# Patient Record
Sex: Male | Born: 1980 | Race: Black or African American | Hispanic: No | Marital: Single | Smoking: Current every day smoker
Health system: Southern US, Community
[De-identification: ages and names within clinical notes are randomized; demographics above are authoritative.]

## PROBLEM LIST (undated history)

## (undated) DIAGNOSIS — K5792 Diverticulitis of intestine, part unspecified, without perforation or abscess without bleeding: Secondary | ICD-10-CM

---

## 2006-01-31 ENCOUNTER — Emergency Department: Payer: Self-pay | Admitting: Emergency Medicine

## 2006-04-24 ENCOUNTER — Emergency Department: Payer: Self-pay | Admitting: Emergency Medicine

## 2006-10-14 ENCOUNTER — Emergency Department: Payer: Self-pay | Admitting: Emergency Medicine

## 2014-01-03 ENCOUNTER — Emergency Department: Payer: Self-pay | Admitting: Emergency Medicine

## 2015-11-22 ENCOUNTER — Emergency Department
Admission: EM | Admit: 2015-11-22 | Discharge: 2015-11-22 | Discharge status: Against Medical Advice | Payer: 59 | Attending: Emergency Medicine | Admitting: Emergency Medicine

## 2015-11-22 ENCOUNTER — Encounter: Payer: Self-pay | Admitting: Emergency Medicine

## 2015-11-22 DIAGNOSIS — F17200 Nicotine dependence, unspecified, uncomplicated: Secondary | ICD-10-CM | POA: Insufficient documentation

## 2015-11-22 DIAGNOSIS — R197 Diarrhea, unspecified: Secondary | ICD-10-CM | POA: Diagnosis not present

## 2015-11-22 DIAGNOSIS — R51 Headache: Secondary | ICD-10-CM | POA: Insufficient documentation

## 2015-11-22 DIAGNOSIS — R1110 Vomiting, unspecified: Secondary | ICD-10-CM | POA: Insufficient documentation

## 2015-11-22 NOTE — ED Notes (Signed)
Reports n/v/d x 2 days, face pain, bodyaches.  Skin w/d.

## 2015-11-23 ENCOUNTER — Telehealth: Payer: Self-pay | Admitting: Emergency Medicine

## 2015-11-23 NOTE — ED Notes (Signed)
Called patient due to lwot to inquire about condition and follow up plans. Left message with person who answered to have him call me for any questions.

## 2016-06-02 ENCOUNTER — Emergency Department
Admission: EM | Admit: 2016-06-02 | Discharge: 2016-06-02 | Disposition: A | Discharge status: Home / Self Care | Payer: 59 | Attending: Emergency Medicine | Admitting: Emergency Medicine

## 2016-06-02 ENCOUNTER — Encounter: Payer: Self-pay | Admitting: Emergency Medicine

## 2016-06-02 ENCOUNTER — Emergency Department: Payer: 59

## 2016-06-02 DIAGNOSIS — R1032 Left lower quadrant pain: Secondary | ICD-10-CM | POA: Diagnosis present

## 2016-06-02 DIAGNOSIS — K5731 Diverticulosis of large intestine without perforation or abscess with bleeding: Secondary | ICD-10-CM | POA: Diagnosis not present

## 2016-06-02 DIAGNOSIS — K5733 Diverticulitis of large intestine without perforation or abscess with bleeding: Secondary | ICD-10-CM

## 2016-06-02 DIAGNOSIS — F17210 Nicotine dependence, cigarettes, uncomplicated: Secondary | ICD-10-CM | POA: Insufficient documentation

## 2016-06-02 LAB — COMPREHENSIVE METABOLIC PANEL
ALT: 18 U/L (ref 17–63)
AST: 16 U/L (ref 15–41)
Albumin: 4.5 g/dL (ref 3.5–5.0)
Alkaline Phosphatase: 54 U/L (ref 38–126)
Anion gap: 7 (ref 5–15)
BILIRUBIN TOTAL: 0.4 mg/dL (ref 0.3–1.2)
BUN: 12 mg/dL (ref 6–20)
CALCIUM: 9.5 mg/dL (ref 8.9–10.3)
CO2: 25 mmol/L (ref 22–32)
CREATININE: 1.05 mg/dL (ref 0.61–1.24)
Chloride: 106 mmol/L (ref 101–111)
GFR calc non Af Amer: >60 mL/min (ref >60)
GLUCOSE: 86 mg/dL (ref 65–99)
Potassium: 4.3 mmol/L (ref 3.5–5.1)
Sodium: 138 mmol/L (ref 135–145)
Total Protein: 7.6 g/dL (ref 6.5–8.1)

## 2016-06-02 LAB — CBC
HEMATOCRIT: 41.8 % (ref 40.0–52.0)
Hemoglobin: 14 g/dL (ref 13.0–18.0)
MCH: 26.8 pg (ref 26.0–34.0)
MCHC: 33.4 g/dL (ref 32.0–36.0)
MCV: 80.4 fL (ref 80.0–100.0)
Platelets: 242 10*3/uL (ref 150–440)
RBC: 5.2 MIL/uL (ref 4.40–5.90)
RDW: 15.1 % — ABNORMAL HIGH (ref 11.5–14.5)
WBC: 10 10*3/uL (ref 3.8–10.6)

## 2016-06-02 LAB — LIPASE, BLOOD: Lipase: 79 U/L — ABNORMAL HIGH (ref 11–51)

## 2016-06-02 MED ORDER — SODIUM CHLORIDE 0.9 % IV BOLUS (SEPSIS)
1000.0000 mL | Freq: Once | INTRAVENOUS | Status: AC
  Administered 2016-06-02: 1000 mL via INTRAVENOUS

## 2016-06-02 MED ORDER — METRONIDAZOLE 500 MG PO TABS
500.0000 mg | ORAL_TABLET | Freq: Once | ORAL | Status: AC
  Administered 2016-06-02: 500 mg via ORAL
  Filled 2016-06-02: qty 1

## 2016-06-02 MED ORDER — CIPROFLOXACIN HCL 500 MG PO TABS
500.0000 mg | ORAL_TABLET | Freq: Once | ORAL | Status: AC
  Administered 2016-06-02: 500 mg via ORAL
  Filled 2016-06-02: qty 1

## 2016-06-02 MED ORDER — DIATRIZOATE MEGLUMINE & SODIUM 66-10 % PO SOLN
15.0000 mL | Freq: Once | ORAL | Status: AC
  Administered 2016-06-02: 15 mL via ORAL

## 2016-06-02 MED ORDER — CIPROFLOXACIN HCL 500 MG PO TABS: 500.0000 mg | ORAL_TABLET | Freq: Two times a day (BID) | ORAL | Status: AC

## 2016-06-02 MED ORDER — METRONIDAZOLE 500 MG PO TABS: 500.0000 mg | ORAL_TABLET | Freq: Three times a day (TID) | ORAL | Status: DC

## 2016-06-02 MED ORDER — OXYCODONE-ACETAMINOPHEN 5-325 MG PO TABS
2.0000 | ORAL_TABLET | Freq: Once | ORAL | Status: AC
  Administered 2016-06-02: 2 via ORAL
  Filled 2016-06-02: qty 2

## 2016-06-02 MED ORDER — MORPHINE SULFATE (PF) 4 MG/ML IV SOLN
4.0000 mg | Freq: Once | INTRAVENOUS | Status: AC
Start: 2016-06-02 — End: 2016-06-02
  Administered 2016-06-02: 4 mg via INTRAVENOUS
  Filled 2016-06-02: qty 1

## 2016-06-02 MED ORDER — ONDANSETRON HCL 4 MG/2ML IJ SOLN
4.0000 mg | Freq: Once | INTRAMUSCULAR | Status: AC
  Administered 2016-06-02: 4 mg via INTRAVENOUS
  Filled 2016-06-02: qty 2

## 2016-06-02 MED ORDER — OXYCODONE-ACETAMINOPHEN 5-325 MG PO TABS: 1.0000 | ORAL_TABLET | Freq: Four times a day (QID) | ORAL | Status: DC | PRN

## 2016-06-02 MED ORDER — IOPAMIDOL (ISOVUE-300) INJECTION 61%
100.0000 mL | Freq: Once | INTRAVENOUS | Status: AC | PRN
  Administered 2016-06-02: 100 mL via INTRAVENOUS

## 2016-06-02 NOTE — ED Provider Notes (Signed)
[facility] Emergency Department Provider Note  Time seen: 12:13 PM  I have reviewed the triage vital signs and the nursing notes.   HISTORY  Chief Complaint Abdominal Pain and Melena    HPI Edward Pennington is a 35 y.o. male with no past medical history presents the emergency department left lower quadrant pain. According to the patient approximately 2 months ago he developed sharp stabbing left lower quadrant pain which lasted approximately one day and then resolved. Patient states for the past 2 weeks he has been experiencing similar symptoms intermittently in the left lower quadrant. Worse pain today which she describes as severe stabbing mostly in the left lower quadrant. He is also noted somewhat dark stool over the past one week. Denies any nausea or vomiting. Denies dysuria. Denies any bloody stool. Patient denies use of Pepto-Bismol or iron supplements.     History reviewed. No pertinent past medical history.  There are no active problems to display for this patient.   History reviewed. No pertinent past surgical history.  No current outpatient prescriptions on file.  Allergies Review of patient's allergies indicates no known allergies.  History reviewed. No pertinent family history.  Social History Social History  Substance Use Topics  . Smoking status: Current Every Day Smoker -- 1.00 packs/day    Types: Cigarettes  . Smokeless tobacco: None  . Alcohol Use: No    Review of Systems Constitutional: Negative for fever. Cardiovascular: Negative for chest pain. Respiratory: Negative for shortness of breath. Gastrointestinal: Left lower quadrant abdominal pain. Dark stool. Genitourinary: Negative for dysuria. Neurological: Negative for headache 10-point ROS otherwise negative.  ____________________________________________   PHYSICAL EXAM:  VITAL SIGNS: ED Triage Vitals  Enc Vitals Group     BP 06/02/16 1134 144/85 mmHg     Pulse  Rate 06/02/16 1134 103     Resp 06/02/16 1134 20     Temp 06/02/16 1134 98.3 F (36.8 C)     Temp Source 06/02/16 1134 Oral     SpO2 06/02/16 1134 99 %     Weight 06/02/16 1134 250 lb (113.399 kg)     Height 06/02/16 1134 6' (1.829 m)     Head Cir --      Peak Flow --      Pain Score 06/02/16 1137 9     Pain Loc --      Pain Edu? --      Excl. in GC? --     Constitutional: Alert and oriented. Well appearing and in no distress. Eyes: Normal exam ENT   Head: Normocephalic and atraumatic.   Mouth/Throat: Mucous membranes are moist. Cardiovascular: Normal rate, regular rhythm. No murmur Respiratory: Normal respiratory effort without tachypnea nor retractions. Breath sounds are clear Gastrointestinal: Soft, moderate left lower quadrant tenderness palpation. Mild left upper quadrant tenderness palpation. No rebound or guarding. No distention. Musculoskeletal: Nontender with normal range of motion in all extremities. Neurologic:  Normal speech and language. No gross focal neurologic deficits  Skin:  Skin is warm, dry and intact.  Psychiatric: Mood and affect are normal.   ____________________________________________   RADIOLOGY  CT consistent with uncomplicated diverticulitis of the sigmoid colon.  ____________________________________________   INITIAL IMPRESSION / ASSESSMENT AND PLAN / ED COURSE  Pertinent labs & imaging results that were available during my care of the patient were reviewed by me and considered in my medical decision making (see chart for details).  The patient presents the emergency department left lower quadrant abdominal pain.  We will check labs, likely proceed with a CT of the abdomen/pelvis. Perform a rectal examination, and closely monitor in the emergency department.  CT consistent with uncomplicated diverticulitis of the sigmoid colon. We'll place on antibiotics and discharged home with pain medication. The patient is agreeable to plan.  Urinalysis is still pending, the urinalysis machine is down and they do not know when it will be running again. As the patient has no urinary complaints. Discharge the patient home and I will follow-up on the urinalysis once available.  ____________________________________________   FINAL CLINICAL IMPRESSION(S) / ED DIAGNOSES  Left lower quadrant abdominal pain Diverticulitis  Minna AntisKevin Garnetta Fedrick, MD 06/02/16 1334

## 2016-06-02 NOTE — Discharge Instructions (Signed)
Please take your antibiotics as prescribed. Pain medication as needed. Please follow-up with GI medicine by calling the number provided to arrange a follow up appointment. Return to the emergency department for any worsening abdominal pain, increased bloody stool, or fever.   Diverticulitis Diverticulitis is when small pockets that have formed in your colon (large intestine) become infected or swollen. HOME CARE  Follow your doctor's instructions.  Follow a special diet if told by your doctor.  When you feel better, your doctor may tell you to change your diet. You may be told to eat a lot of fiber. Fruits and vegetables are good sources of fiber. Fiber makes it easier to poop (have bowel movements).  Take supplements or probiotics as told by your doctor.  Only take medicines as told by your doctor.  Keep all follow-up visits with your doctor. GET HELP IF:  Your pain does not get better.  You have a hard time eating food.  You are not pooping like normal. GET HELP RIGHT AWAY IF:  Your pain gets worse.  Your problems do not get better.  Your problems suddenly get worse.  You have a fever.  You keep throwing up (vomiting).  You have bloody or black, tarry poop (stool). MAKE SURE YOU:   Understand these instructions.  Will watch your condition.  Will get help right away if you are not doing well or get worse.   This information is not intended to replace advice given to you by your health care provider. Make sure you discuss any questions you have with your health care provider.   Document Released: 05/15/2008 Document Revised: 12/02/2013 Document Reviewed: 10/22/2013 Elsevier Interactive Patient Education [employer]2016 [employer]vier Inc.

## 2016-06-02 NOTE — ED Notes (Signed)
Pt reports pain in bottom of stomach below navel x 2 weeks.  Pt states he has seen black stools for the past 4-5 days.  Similar episode happened 2 months ago and last a week. Pt describe pain as feeling like a knife stabbing him in the stomach. Denies vomiting.

## 2016-06-08 ENCOUNTER — Emergency Department
Admission: EM | Admit: 2016-06-08 | Discharge: 2016-06-08 | Disposition: A | Discharge status: Home / Self Care | Payer: 59 | Attending: Emergency Medicine | Admitting: Emergency Medicine

## 2016-06-08 ENCOUNTER — Encounter: Payer: Self-pay | Admitting: Emergency Medicine

## 2016-06-08 DIAGNOSIS — F17210 Nicotine dependence, cigarettes, uncomplicated: Secondary | ICD-10-CM | POA: Diagnosis not present

## 2016-06-08 DIAGNOSIS — R1032 Left lower quadrant pain: Secondary | ICD-10-CM | POA: Diagnosis present

## 2016-06-08 DIAGNOSIS — Z792 Long term (current) use of antibiotics: Secondary | ICD-10-CM | POA: Diagnosis not present

## 2016-06-08 HISTORY — DX: Diverticulitis of intestine, part unspecified, without perforation or abscess without bleeding: K57.92

## 2016-06-08 LAB — CBC WITH DIFFERENTIAL/PLATELET
BASOS ABS: 0 10*3/uL (ref 0–0.1)
BASOS PCT: 1 %
Eosinophils Absolute: 0.9 10*3/uL — ABNORMAL HIGH (ref 0–0.7)
Eosinophils Relative: 21 %
HEMATOCRIT: 39.1 % — AB (ref 40.0–52.0)
HEMOGLOBIN: 13.2 g/dL (ref 13.0–18.0)
LYMPHS PCT: 37 %
Lymphs Abs: 1.5 10*3/uL (ref 1.0–3.6)
MCH: 27.1 pg (ref 26.0–34.0)
MCHC: 33.8 g/dL (ref 32.0–36.0)
MCV: 80.1 fL (ref 80.0–100.0)
Monocytes Absolute: 0.5 10*3/uL (ref 0.2–1.0)
Monocytes Relative: 13 %
NEUTROS ABS: 1.1 10*3/uL — AB (ref 1.4–6.5)
NEUTROS PCT: 28 %
Platelets: 267 10*3/uL (ref 150–440)
RBC: 4.88 MIL/uL (ref 4.40–5.90)
RDW: 14.8 % — AB (ref 11.5–14.5)
WBC: 4 10*3/uL (ref 3.8–10.6)

## 2016-06-08 LAB — COMPREHENSIVE METABOLIC PANEL
ALT: 21 U/L (ref 17–63)
ANION GAP: 8 (ref 5–15)
AST: 19 U/L (ref 15–41)
Albumin: 3.8 g/dL (ref 3.5–5.0)
Alkaline Phosphatase: 42 U/L (ref 38–126)
BILIRUBIN TOTAL: 0.2 mg/dL — AB (ref 0.3–1.2)
BUN: 10 mg/dL (ref 6–20)
CHLORIDE: 109 mmol/L (ref 101–111)
CO2: 24 mmol/L (ref 22–32)
Calcium: 9 mg/dL (ref 8.9–10.3)
Creatinine, Ser: 1.06 mg/dL (ref 0.61–1.24)
GFR calc Af Amer: >60 mL/min (ref >60)
Glucose, Bld: 91 mg/dL (ref 65–99)
POTASSIUM: 3.8 mmol/L (ref 3.5–5.1)
Sodium: 141 mmol/L (ref 135–145)
TOTAL PROTEIN: 6.9 g/dL (ref 6.5–8.1)

## 2016-06-08 LAB — LIPASE, BLOOD: LIPASE: 25 U/L (ref 11–51)

## 2016-06-08 MED ORDER — SODIUM CHLORIDE 0.9 % IV BOLUS (SEPSIS)
1000.0000 mL | Freq: Once | INTRAVENOUS | Status: AC
  Administered 2016-06-08: 1000 mL via INTRAVENOUS

## 2016-06-08 MED ORDER — ONDANSETRON HCL 4 MG/2ML IJ SOLN
4.0000 mg | Freq: Once | INTRAMUSCULAR | Status: AC
  Administered 2016-06-08: 4 mg via INTRAVENOUS
  Filled 2016-06-08: qty 2

## 2016-06-08 MED ORDER — KETOROLAC TROMETHAMINE 60 MG/2ML IM SOLN
INTRAMUSCULAR | Status: AC
  Filled 2016-06-08: qty 2

## 2016-06-08 MED ORDER — NAPROXEN 500 MG PO TABS: 500.0000 mg | ORAL_TABLET | Freq: Two times a day (BID) | ORAL | Status: AC

## 2016-06-08 MED ORDER — ONDANSETRON 4 MG PO TBDP: 4.0000 mg | ORAL_TABLET | Freq: Three times a day (TID) | ORAL | Status: AC | PRN

## 2016-06-08 MED ORDER — KETOROLAC TROMETHAMINE 30 MG/ML IJ SOLN
15.0000 mg | Freq: Once | INTRAMUSCULAR | Status: AC
  Administered 2016-06-08: 15 mg via INTRAVENOUS

## 2016-06-08 NOTE — ED Provider Notes (Signed)
[facility] Emergency Department Provider Note  ____________________________________________  Time seen: 10:15 AM  I have reviewed the triage vital signs and the nursing notes.   HISTORY  Chief Complaint Abdominal Pain    HPI Edward Pennington is a 35 y.o. male who complains of left-sided abdominal pain for the past week. He reports that he was seen in the emergency room about a week ago, had a CT scan and labs, diagnosed with diverticulitis and started on antibiotics. He reports however though with that he's been having increased nausea which is causing frequent vomiting. He is taking the antibiotics but also feels like he vomited so much is not sure if he is actually absorbing it. Having trouble eating and drinking as well. No chest pain shortness of breath fever chills or sweats. No dizziness or syncope.  Pain is in left lower quadrant, nonradiating. Moderately severe, aching. No aggravating or alleviating factors.     Past Medical History  Diagnosis Date  . Diverticulitis      There are no active problems to display for this patient.    History reviewed. No pertinent past surgical history.   Current Outpatient Rx  Name  Route  Sig  Dispense  Refill  . ciprofloxacin (CIPRO) 500 MG tablet   Oral   Take 1 tablet (500 mg total) by mouth 2 (two) times daily.   28 tablet   0   . metroNIDAZOLE (FLAGYL) 500 MG tablet   Oral   Take 1 tablet (500 mg total) by mouth 3 (three) times daily.   42 tablet   0   . naproxen (NAPROSYN) 500 MG tablet   Oral   Take 1 tablet (500 mg total) by mouth 2 (two) times daily with a meal.   20 tablet   0   . ondansetron (ZOFRAN ODT) 4 MG disintegrating tablet   Oral   Take 1 tablet (4 mg total) by mouth every 8 (eight) hours as needed for nausea or vomiting.   20 tablet   0   . oxyCODONE-acetaminophen (ROXICET) 5-325 MG tablet   Oral   Take 1 tablet by mouth every 6 (six) hours as needed.   20 tablet    0      Allergies Review of patient's allergies indicates no known allergies.   History reviewed. No pertinent family history.  Social History Social History  Substance Use Topics  . Smoking status: Current Every Day Smoker -- 1.00 packs/day    Types: Cigarettes  . Smokeless tobacco: None  . Alcohol Use: No    Review of Systems  Constitutional:   No fever or chills.  ENT:   No sore throat. No rhinorrhea. Cardiovascular:   No chest pain. Respiratory:   No dyspnea or cough. Gastrointestinal:   Left lower quadrant abdominal pain as above with vomiting, no diarrhea..  Genitourinary:   Negative for dysuria or difficulty urinating. Musculoskeletal:   Negative for focal pain or swelling Neurological:   Negative for headaches 10-point ROS otherwise negative.  ____________________________________________   PHYSICAL EXAM:  VITAL SIGNS: ED Triage Vitals  Enc Vitals Group     BP 06/08/16 0954 137/83 mmHg     Pulse Rate 06/08/16 0954 87     Resp 06/08/16 0954 22     Temp 06/08/16 0954 98 F (36.7 C)     Temp Source 06/08/16 0954 Oral     SpO2 06/08/16 0954 99 %     Weight 06/08/16 0954 250 lb (113.399 kg)  Height 06/08/16 0954 6' (1.829 m)     Head Cir --      Peak Flow --      Pain Score 06/08/16 0954 8     Pain Loc --      Pain Edu? --      Excl. in GC? --     Vital signs reviewed, nursing assessments reviewed.   Constitutional:   Alert and oriented. Well appearing and in no distress. Eyes:   No scleral icterus. No conjunctival pallor. PERRL. EOMI.  No nystagmus. ENT   Head:   Normocephalic and atraumatic.   Nose:   No congestion/rhinnorhea. No septal hematoma   Mouth/Throat:   MMM, no pharyngeal erythema. No peritonsillar mass.    Neck:   No stridor. No SubQ emphysema. No meningismus. Hematological/Lymphatic/Immunilogical:   No cervical lymphadenopathy. Cardiovascular:   RRR. Symmetric bilateral radial and DP pulses.  No murmurs.  Respiratory:    Normal respiratory effort without tachypnea nor retractions. Breath sounds are clear and equal bilaterally. No wheezes/rales/rhonchi. Gastrointestinal:   Soft With left-sided abdominal tenderness.. Non distended. There is no CVA tenderness.  No rebound, rigidity, or guarding. Genitourinary:   deferred Musculoskeletal:   Nontender with normal range of motion in all extremities. No joint effusions.  No lower extremity tenderness.  No edema. Neurologic:   Normal speech and language.  CN 2-10 normal. Motor grossly intact. No gross focal neurologic deficits are appreciated.  Skin:    Skin is warm, dry and intact. No rash noted.  No petechiae, purpura, or bullae.  ____________________________________________    LABS (pertinent positives/negatives) (all labs ordered are listed, but only abnormal results are displayed) Labs Reviewed  COMPREHENSIVE METABOLIC PANEL - Abnormal; Notable for the following:    Total Bilirubin 0.2 (*)    All other components within normal limits  CBC WITH DIFFERENTIAL/PLATELET - Abnormal; Notable for the following:    HCT 39.1 (*)    RDW 14.8 (*)    Neutro Abs 1.1 (*)    Eosinophils Absolute 0.9 (*)    All other components within normal limits  LIPASE, BLOOD  URINALYSIS COMPLETEWITH MICROSCOPIC ([facility] ONLY)   ____________________________________________   EKG    ____________________________________________    RADIOLOGY    ____________________________________________   PROCEDURES   ____________________________________________   INITIAL IMPRESSION / ASSESSMENT AND PLAN / ED COURSE  Pertinent labs & imaging results that were available during my care of the patient were reviewed by me and considered in my medical decision making (see chart for details).  Patient well appearing no acute distress. CT from just 6 days ago shows uncomplicated diverticulitis without any evidence of perforation or abscess formation. Vital signs unremarkable today.  We'll repeat labs while controlling symptoms with IV fluids and antiemetics. If there are significant abnormalities we may need to repeat CT today, but if not the patient should be medically stable to continue with symptomatic management at home.   ----------------------------------------- 1:43 PM on 06/08/2016 -----------------------------------------  Vitals normal, labs normal. Exam reassuring. Patient unable to provide urine sample, instead requests to leave and wants a work note. Have him follow up with primary care. Prescribed Zofran. Low suspicion for any interval complication of diverticulitis such as abscess or perforation. Abdomen is not surgical    ____________________________________________   FINAL CLINICAL IMPRESSION(S) / ED DIAGNOSES  Final diagnoses:  Left lower quadrant pain  Uncomplicated diverticulitis     Portions of this note were generated with dragon dictation software. Dictation errors may occur despite best attempts  at proofreading.   Sharman CheekPhillip Sheyann Sulton, MD 06/08/16 1344

## 2016-06-08 NOTE — ED Notes (Signed)
Pt states he is unable to provide a urine sample, requesting to see the MD. EDP informed

## 2016-06-08 NOTE — ED Notes (Signed)
Pt returns after a visit to ED last Friday; diagnosed with diverticulitis. Told pain would get better in a couple of days, but states that he has had continued pain. Pt reports loose stools, but they are not black as they were before. Pt alert & oriented with NAD noted.

## 2016-06-08 NOTE — Discharge Instructions (Signed)

## 2016-08-09 ENCOUNTER — Ambulatory Visit: Payer: 59 | Admitting: Family Medicine

## 2016-08-09 DIAGNOSIS — Z0289 Encounter for other administrative examinations: Secondary | ICD-10-CM

## 2017-03-16 ENCOUNTER — Emergency Department: Payer: Self-pay

## 2017-03-16 ENCOUNTER — Encounter: Payer: Self-pay | Admitting: Emergency Medicine

## 2017-03-16 ENCOUNTER — Observation Stay
Admission: EM | Admit: 2017-03-16 | Discharge: 2017-03-18 | Disposition: A | Discharge status: Home / Self Care | Payer: Self-pay | Attending: General Surgery | Admitting: General Surgery

## 2017-03-16 DIAGNOSIS — Z8249 Family history of ischemic heart disease and other diseases of the circulatory system: Secondary | ICD-10-CM | POA: Insufficient documentation

## 2017-03-16 DIAGNOSIS — K5732 Diverticulitis of large intestine without perforation or abscess without bleeding: Principal | ICD-10-CM | POA: Insufficient documentation

## 2017-03-16 DIAGNOSIS — F17210 Nicotine dependence, cigarettes, uncomplicated: Secondary | ICD-10-CM | POA: Insufficient documentation

## 2017-03-16 DIAGNOSIS — K5792 Diverticulitis of intestine, part unspecified, without perforation or abscess without bleeding: Secondary | ICD-10-CM | POA: Diagnosis present

## 2017-03-16 DIAGNOSIS — Q631 Lobulated, fused and horseshoe kidney: Secondary | ICD-10-CM | POA: Insufficient documentation

## 2017-03-16 DIAGNOSIS — Z833 Family history of diabetes mellitus: Secondary | ICD-10-CM | POA: Insufficient documentation

## 2017-03-16 LAB — CBC
HCT: 42.8 % (ref 40.0–52.0)
Hemoglobin: 14.3 g/dL (ref 13.0–18.0)
MCH: 27.3 pg (ref 26.0–34.0)
MCHC: 33.5 g/dL (ref 32.0–36.0)
MCV: 81.3 fL (ref 80.0–100.0)
PLATELETS: 293 10*3/uL (ref 150–440)
RBC: 5.26 MIL/uL (ref 4.40–5.90)
RDW: 14.7 % — ABNORMAL HIGH (ref 11.5–14.5)
WBC: 7.8 10*3/uL (ref 3.8–10.6)

## 2017-03-16 LAB — URINALYSIS, COMPLETE (UACMP) WITH MICROSCOPIC
BILIRUBIN URINE: NEGATIVE
Bacteria, UA: NONE SEEN
GLUCOSE, UA: NEGATIVE mg/dL
Hgb urine dipstick: NEGATIVE
KETONES UR: NEGATIVE mg/dL
LEUKOCYTES UA: NEGATIVE
Nitrite: NEGATIVE
PH: 6 (ref 5.0–8.0)
Protein, ur: NEGATIVE mg/dL
Specific Gravity, Urine: 1.019 (ref 1.005–1.030)
Squamous Epithelial / LPF: NONE SEEN

## 2017-03-16 LAB — COMPREHENSIVE METABOLIC PANEL
ALT: 17 U/L (ref 17–63)
ANION GAP: 5 (ref 5–15)
AST: 14 U/L — AB (ref 15–41)
Albumin: 4.2 g/dL (ref 3.5–5.0)
Alkaline Phosphatase: 46 U/L (ref 38–126)
BILIRUBIN TOTAL: 0.7 mg/dL (ref 0.3–1.2)
BUN: 9 mg/dL (ref 6–20)
CO2: 26 mmol/L (ref 22–32)
Calcium: 9.3 mg/dL (ref 8.9–10.3)
Chloride: 107 mmol/L (ref 101–111)
Creatinine, Ser: 1.06 mg/dL (ref 0.61–1.24)
Glucose, Bld: 99 mg/dL (ref 65–99)
POTASSIUM: 4.3 mmol/L (ref 3.5–5.1)
Sodium: 138 mmol/L (ref 135–145)
TOTAL PROTEIN: 7.7 g/dL (ref 6.5–8.1)

## 2017-03-16 LAB — LIPASE, BLOOD: Lipase: 11 U/L (ref 11–51)

## 2017-03-16 MED ORDER — SODIUM CHLORIDE 0.9 % IV BOLUS (SEPSIS)
1000.0000 mL | Freq: Once | INTRAVENOUS | Status: AC
Start: 2017-03-16 — End: 2017-03-16
  Administered 2017-03-16: 1000 mL via INTRAVENOUS

## 2017-03-16 MED ORDER — PANTOPRAZOLE SODIUM 40 MG IV SOLR
40.0000 mg | Freq: Every day | INTRAVENOUS | Status: DC
  Administered 2017-03-16 – 2017-03-17 (×2): 40 mg via INTRAVENOUS
  Filled 2017-03-16 (×2): qty 40

## 2017-03-16 MED ORDER — NICOTINE 14 MG/24HR TD PT24
14.0000 mg | MEDICATED_PATCH | Freq: Every day | TRANSDERMAL | Status: DC
  Administered 2017-03-16 – 2017-03-17 (×2): 14 mg via TRANSDERMAL
  Filled 2017-03-16 (×2): qty 1

## 2017-03-16 MED ORDER — MORPHINE SULFATE (PF) 4 MG/ML IV SOLN
INTRAVENOUS | Status: AC
  Filled 2017-03-16: qty 1

## 2017-03-16 MED ORDER — ONDANSETRON 4 MG PO TBDP
4.0000 mg | ORAL_TABLET | Freq: Four times a day (QID) | ORAL | Status: DC | PRN
Start: 2017-03-16 — End: 2017-03-18

## 2017-03-16 MED ORDER — PIPERACILLIN-TAZOBACTAM 3.375 G IVPB
3.3750 g | Freq: Three times a day (TID) | INTRAVENOUS | Status: DC
  Administered 2017-03-16 – 2017-03-18 (×5): 3.375 g via INTRAVENOUS
  Filled 2017-03-16 (×6): qty 50

## 2017-03-16 MED ORDER — KETOROLAC TROMETHAMINE 30 MG/ML IJ SOLN
30.0000 mg | Freq: Four times a day (QID) | INTRAMUSCULAR | Status: DC
  Administered 2017-03-16 – 2017-03-17 (×2): 30 mg via INTRAVENOUS
  Filled 2017-03-16 (×2): qty 1

## 2017-03-16 MED ORDER — MORPHINE SULFATE (PF) 2 MG/ML IV SOLN
2.0000 mg | Freq: Once | INTRAVENOUS | Status: AC
  Administered 2017-03-16: 2 mg via INTRAVENOUS
  Filled 2017-03-16: qty 1

## 2017-03-16 MED ORDER — ONDANSETRON HCL 4 MG/2ML IJ SOLN
4.0000 mg | Freq: Once | INTRAMUSCULAR | Status: AC
  Administered 2017-03-16: 4 mg via INTRAVENOUS

## 2017-03-16 MED ORDER — MORPHINE SULFATE (PF) 4 MG/ML IV SOLN
4.0000 mg | Freq: Once | INTRAVENOUS | Status: AC
  Administered 2017-03-16: 4 mg via INTRAVENOUS

## 2017-03-16 MED ORDER — MORPHINE SULFATE (PF) 4 MG/ML IV SOLN
4.0000 mg | Freq: Once | INTRAVENOUS | Status: AC
  Administered 2017-03-16: 4 mg via INTRAVENOUS
  Filled 2017-03-16: qty 1

## 2017-03-16 MED ORDER — PIPERACILLIN-TAZOBACTAM 3.375 G IVPB 30 MIN
3.3750 g | Freq: Once | INTRAVENOUS | Status: AC
  Administered 2017-03-16: 3.375 g via INTRAVENOUS
  Filled 2017-03-16: qty 50

## 2017-03-16 MED ORDER — MORPHINE SULFATE (PF) 4 MG/ML IV SOLN
4.0000 mg | INTRAVENOUS | Status: DC | PRN
  Administered 2017-03-16: 4 mg via INTRAVENOUS
  Filled 2017-03-16: qty 1

## 2017-03-16 MED ORDER — HYDROMORPHONE HCL 1 MG/ML IJ SOLN
0.5000 mg | INTRAMUSCULAR | Status: DC | PRN
  Administered 2017-03-16: 0.5 mg via INTRAVENOUS
  Filled 2017-03-16: qty 0.5

## 2017-03-16 MED ORDER — IOPAMIDOL (ISOVUE-300) INJECTION 61%
30.0000 mL | Freq: Once | INTRAVENOUS | Status: AC | PRN
  Administered 2017-03-16: 30 mL via ORAL

## 2017-03-16 MED ORDER — ONDANSETRON HCL 4 MG/2ML IJ SOLN
4.0000 mg | Freq: Four times a day (QID) | INTRAMUSCULAR | Status: DC | PRN
  Administered 2017-03-16: 4 mg via INTRAVENOUS
  Filled 2017-03-16: qty 2

## 2017-03-16 MED ORDER — DEXTROSE IN LACTATED RINGERS 5 % IV SOLN
INTRAVENOUS | Status: DC
  Administered 2017-03-16 – 2017-03-17 (×2): via INTRAVENOUS

## 2017-03-16 MED ORDER — DIPHENHYDRAMINE HCL 50 MG/ML IJ SOLN
25.0000 mg | Freq: Every evening | INTRAMUSCULAR | Status: DC | PRN
  Administered 2017-03-16: 25 mg via INTRAVENOUS
  Filled 2017-03-16: qty 1

## 2017-03-16 MED ORDER — DIPHENHYDRAMINE HCL 25 MG PO CAPS: 25.0000 mg | ORAL_CAPSULE | Freq: Four times a day (QID) | ORAL | Status: DC | PRN

## 2017-03-16 MED ORDER — ONDANSETRON HCL 4 MG/2ML IJ SOLN
INTRAMUSCULAR | Status: AC
  Filled 2017-03-16: qty 2

## 2017-03-16 MED ORDER — IOPAMIDOL (ISOVUE-300) INJECTION 61%
100.0000 mL | Freq: Once | INTRAVENOUS | Status: AC | PRN
  Administered 2017-03-16: 100 mL via INTRAVENOUS

## 2017-03-16 MED ORDER — HYDRALAZINE HCL 20 MG/ML IJ SOLN: 10.0000 mg | INTRAMUSCULAR | Status: DC | PRN

## 2017-03-16 MED ORDER — DIPHENHYDRAMINE HCL 50 MG/ML IJ SOLN: 25.0000 mg | Freq: Four times a day (QID) | INTRAMUSCULAR | Status: DC | PRN

## 2017-03-16 NOTE — ED Triage Notes (Signed)
Pt to ed with c/o left lower abd pain that started on Tuesday pm, reports last bm was yesterday and was black in color.  Pt reports hx of diverticulitis.

## 2017-03-16 NOTE — ED Notes (Signed)
Patient transported to CT 

## 2017-03-16 NOTE — Plan of Care (Signed)
Problem: Safety: Goal: Ability to remain free from injury will improve Outcome: Progressing Patient educated on importance of calling for assistance when getting out of bed if needed.

## 2017-03-16 NOTE — H&P (Signed)
Patient ID: Edward Pennington, male   DOB: 28-Jan-1981, 36 y.o.   MRN: [id number]  CC: ABDOMINAL PAIN  HPI Edward Pennington is a 36 y.o. male who presents to emergency room with a four-day history of left lower quadrant abdominal pain. Patient reports the pain is severe and has been keeping him up at night. He's been unable to eat for the last day secondary to the pain. Patient reports he had an episode similar to this last summer and was diagnosed with diverticulitis at that time. He states she's also been having looser stools of normal as well as some nausea but no vomiting. He denies any fevers, chills, chest pain, shortness of breath, cough. He is otherwise in his usual state of health.  HPI  Past Medical History:  Diagnosis Date  . Diverticulitis     History reviewed. No pertinent surgical history. Patient has ever had any surgery.  History reviewed. No pertinent family history. Patient reports that his mother's side of family has diabetes but denies any known cardiac disease or cancers.  Social History Social History  Substance Use Topics  . Smoking status: Current Every Day Smoker    Packs/day: 1.00    Types: Cigarettes  . Smokeless tobacco: Never Used  . Alcohol use No    No Known Allergies  Current Facility-Administered Medications  Medication Dose Route Frequency Provider Last Rate Last Dose  . dextrose 5 % in lactated ringers infusion   Intravenous Continuous Ricarda Frame, MD      . diphenhydrAMINE (BENADRYL) capsule 25 mg  25 mg Oral Q6H PRN Ricarda Frame, MD       Or  . diphenhydrAMINE (BENADRYL) injection 25 mg  25 mg Intravenous Q6H PRN Ricarda Frame, MD      . hydrALAZINE (APRESOLINE) injection 10 mg  10 mg Intravenous Q2H PRN Ricarda Frame, MD      . morphine 4 MG/ML injection 4 mg  4 mg Intravenous Q4H PRN Ricarda Frame, MD   4 mg at 03/16/17 1754  . nicotine (NICODERM CQ - dosed in mg/24 hours) patch 14 mg  14 mg Transdermal Daily Ricarda Frame, MD       . ondansetron (ZOFRAN-ODT) disintegrating tablet 4 mg  4 mg Oral Q6H PRN Ricarda Frame, MD       Or  . ondansetron [facility]) injection 4 mg  4 mg Intravenous Q6H PRN Ricarda Frame, MD      . pantoprazole (PROTONIX) injection 40 mg  40 mg Intravenous QHS Ricarda Frame, MD      . piperacillin-tazobactam (ZOSYN) IVPB 3.375 g  3.375 g Intravenous Q8H Ricarda Frame, MD         Review of Systems A Multi-point review of systems was asked and was negative except for the findings documented in the history of present illness  Physical Exam Blood pressure (!) 148/75, pulse 79, temperature 98.6 F (37 C), temperature source Oral, resp. rate 16, height 6' (1.829 m), weight 113.4 kg (250 lb), SpO2 99 %. CONSTITUTIONAL: No acute distress. EYES: Pupils are equal, round, and reactive to light, Sclera are non-icteric. EARS, NOSE, MOUTH AND THROAT: The oropharynx is clear. The oral mucosa is pink and moist. Hearing is intact to voice. LYMPH NODES:  Lymph nodes in the neck are normal. RESPIRATORY:  Lungs are clear. There is normal respiratory effort, with equal breath sounds bilaterally, and without pathologic use of accessory muscles. CARDIOVASCULAR: Heart is regular without murmurs, gallops, or rubs. GI: The abdomen is  soft, exquisitely  tender to palpation in the left lower quadrant, and nondistended. There are no palpable masses. There is no hepatosplenomegaly. There are normal bowel sounds in all quadrants. GU: Rectal deferred.   MUSCULOSKELETAL: Normal muscle strength and tone. No cyanosis or edema.   SKIN: Turgor is good and there are no pathologic skin lesions or ulcers. NEUROLOGIC: Motor and sensation is grossly normal. Cranial nerves are grossly intact. PSYCH:  Oriented to person, place and time. Affect is normal.  Data Reviewed Labs are mostly unremarkable and within normal limits. White blood cell count of 7.8. CT scan of the abdomen does show inflammation of the sigmoid colon with some  pericolonic fluid and a focal phlegmon. Per the radiologist's it is worse in appearance and a previous study performed last year. I have personally reviewed the patient's imaging, laboratory findings and medical records.    Assessment    Sigmoid diverticulitis    Plan    36 year old male with sigmoid diverticulitis. Although he is afebrile and his labs are normal he is exquisitely tender and imaging shows progressively worsened diverticulitis from his previous attack. Given this finding and recommended to the patient he come in the hospital overnight for IV antibiotics. Discussed that once his pain is improved we will transition him to oral antibiotics for an additional round of oral treatments. Discussed should he not improve he may require urgent surgery. Given that the services second attack in the last year it may be prudent to discuss elective sigmoid colectomy once his bout has resolved. All questions answered to patient's satisfaction and he voiced understanding. Plan to bring in the hospital for observation and IV antibiotics.     Time spent with the patient was 45 minutes, with more than 50% of the time spent in face-to-face education, counseling and care coordination.     Ricarda Frame, MD FACS General Surgeon 03/16/2017, 6:44 PM

## 2017-03-16 NOTE — ED Notes (Signed)
Waiting on surgery consult. No needs at this time. [name] remains at bedside.

## 2017-03-16 NOTE — ED Provider Notes (Signed)
[facility] Emergency Department Provider Note   ____________________________________________   First MD Initiated Contact with Patient 03/16/17 1303     (approximate)  I have reviewed the triage vital signs and the nursing notes.   HISTORY  Chief Complaint Abdominal Pain    HPI Edward Pennington is a 36 y.o. male previous history of diverticulitis. Patient reports he thinks he is having another episode of "diverticulitis". On Tuesday he began experiencing slightly looser and slightly darker than normal stools. He had increasing pain steadily worsening in his left lower abdomen. Made worse by movement or coughing. No fevers or chills. Some nausea but no vomiting. Is able to eat some today but has been hydrating and drinking less due to decreased appetite.  No blood in his stool. Takes no blood thinners.  Denies any groin pain, testicular pain or trouble with the penis. He has not had any chest pain or trouble breathing. He reports in the past years treated with Flagyl and Cipro for diverticulitis sometime about the last year. Denies surgical history.   Past Medical History:  Diagnosis Date  . Diverticulitis     Patient Active Problem List   Diagnosis Date Noted  . Diverticulitis 03/16/2017    History reviewed. No pertinent surgical history.  Prior to Admission medications   Medication Sig Start Date End Date Taking? Authorizing Provider  naproxen (NAPROSYN) 500 MG tablet Take 1 tablet (500 mg total) by mouth 2 (two) times daily with a meal. Patient not taking: Reported on 03/16/2017 06/08/16   Sharman Cheek, MD  ondansetron (ZOFRAN ODT) 4 MG disintegrating tablet Take 1 tablet (4 mg total) by mouth every 8 (eight) hours as needed for nausea or vomiting. Patient not taking: Reported on 03/16/2017 06/08/16   Sharman Cheek, MD  oxyCODONE-acetaminophen (ROXICET) 5-325 MG tablet Take 1 tablet by mouth every 6 (six) hours as needed. Patient not taking:  Reported on 03/16/2017 06/02/16   Minna Antis, MD  Currently not taking any medications, above listed have all been completed  Allergies Patient has no known allergies.  History reviewed. No pertinent family history.  Social History Social History  Substance Use Topics  . Smoking status: Current Every Day Smoker    Packs/day: 1.00    Types: Cigarettes  . Smokeless tobacco: Never Used  . Alcohol use No    Review of Systems Constitutional: No fever/chills Eyes: No visual changes. ENT: No sore throat. Cardiovascular: Denies chest pain. Respiratory: Denies shortness of breath. Gastrointestinal: No vomiting.   No constipation. Genitourinary: Negative for dysuria. Musculoskeletal: Negative for back pain. Skin: Negative for rash. Neurological: Negative for headachesOr weakness.  10-point ROS otherwise negative.  ____________________________________________   PHYSICAL EXAM:  VITAL SIGNS: ED Triage Vitals  Enc Vitals Group     BP 03/16/17 1132 137/71     Pulse Rate 03/16/17 1132 98     Resp 03/16/17 1132 16     Temp 03/16/17 1132 98.4 F (36.9 C)     Temp Source 03/16/17 1132 Oral     SpO2 03/16/17 1132 99 %     Weight 03/16/17 1116 250 lb (113.4 kg)     Height 03/16/17 1116 6' (1.829 m)     Head Circumference --      Peak Flow --      Pain Score 03/16/17 1115 10     Pain Loc --      Pain Edu? --      Excl. in GC? --  Constitutional: Alert and oriented. Well appearing and in no acute distress.Pleasant. Eyes: Conjunctivae are normal. PERRL. EOMI. Head: Atraumatic. Nose: No congestion/rhinnorhea. Mouth/Throat: Mucous membranes are moist.  Oropharynx non-erythematous. Neck: No stridor.   Cardiovascular: Normal rate, regular rhythm. Grossly normal heart sounds.  Good peripheral circulation. Respiratory: Normal respiratory effort.  No retractions. Lungs CTAB. Gastrointestinal: Soft and moderately tender with mild to moderate tenderness to percussion and  rebound primarily focused in the left lower quadrant. Minimal discomfort in the remaining quadrants. No distention. Musculoskeletal: No lower extremity tenderness nor edema.   Neurologic:  Normal speech and language. No gross focal neurologic deficits are appreciated.  Skin:  Skin is warm, dry and intact. No rash noted. Psychiatric: Mood and affect are normal. Speech and behavior are normal.  ____________________________________________   LABS (all labs ordered are listed, but only abnormal results are displayed)  Labs Reviewed  COMPREHENSIVE METABOLIC PANEL - Abnormal; Notable for the following:       Result Value   AST 14 (*)    All other components within normal limits  CBC - Abnormal; Notable for the following:    RDW 14.7 (*)    All other components within normal limits  URINALYSIS, COMPLETE (UACMP) WITH MICROSCOPIC - Abnormal; Notable for the following:    Color, Urine YELLOW (*)    APPearance CLEAR (*)    All other components within normal limits  LIPASE, BLOOD   ____________________________________________  EKG   ____________________________________________  RADIOLOGY  Ct Abdomen Pelvis W Contrast  Result Date: 03/16/2017 CLINICAL DATA:  Left lower quadrant pain for 4 days. History of diverticulitis. EXAM: CT ABDOMEN AND PELVIS WITH CONTRAST TECHNIQUE: Multidetector CT imaging of the abdomen and pelvis was performed using the standard protocol following bolus administration of intravenous contrast. CONTRAST:  ISOVUE-300 IOPAMIDOL (ISOVUE-300) INJECTION 61% COMPARISON:  CT scan dated 06/02/2016 FINDINGS: Lower chest: Negative. Hepatobiliary: No focal liver abnormality is seen. No gallstones, gallbladder wall thickening, or biliary dilatation. Pancreas: Unremarkable. No pancreatic ductal dilatation or surrounding inflammatory changes. Spleen: Normal in size without focal abnormality. Adrenals/Urinary Tract: Normal adrenal glands. Congenital crossed fused ectopia. No  hydronephrosis. Tiny stone seen in the left portion of the fused kidney on the prior study is no longer present. Bladder is normal. Stomach/Bowel: New focal diverticulitis of the distal descending colon with soft tissue stranding and a small amount of pericolonic fluid. There is a small phlegmon at that site. No discrete abscess or perforation. The bowel otherwise appears normal. Vascular/Lymphatic: No significant vascular findings are present. No enlarged abdominal or pelvic lymph nodes. Reproductive: Prostate is unremarkable. Other: No abdominal wall hernia or abnormality. No abdominopelvic ascites. Musculoskeletal: New broad-based soft disc protrusion at L4-5. Otherwise normal. IMPRESSION: Focal diverticulitis of the distal descending colon with a small focal phlegmon and pericolonic fluid. The appearance is slightly worse than on the prior study of 2017. No other acute findings. Crossed fused renal ectopia. Electronically Signed   By: Francene Boyers M.D.   On: 03/16/2017 14:16    ____________________________________________   PROCEDURES  Procedure(s) performed: None  Procedures  Critical Care performed: No  ____________________________________________   INITIAL IMPRESSION / ASSESSMENT AND PLAN / ED COURSE  Pertinent labs & imaging results that were available during my care of the patient were reviewed by me and considered in my medical decision making (see chart for details).  Differential diagnosis includes but is not limited to, abdominal perforation, aortic dissection, cholecystitis, appendicitis, diverticulitis, colitis, esophagitis/gastritis, kidney stone, pyelonephritis, urinary tract infection, aortic  aneurysm. All are considered in decision and treatment plan. Based upon the patient's presentation and risk factors, highly suspicious for recurrent diverticulitis or other acute abdominal process primarily in the left lower quadrant. Denies any urinary symptoms.   Patient admitted  to general surgery, Dr. Tonita Cong. Appears to be improving, pain is improving.     ____________________________________________   FINAL CLINICAL IMPRESSION(S) / ED DIAGNOSES  Final diagnoses:  Acute diverticulitis of intestine      NEW MEDICATIONS STARTED DURING THIS VISIT:  New Prescriptions   No medications on file     Note:  This document was prepared using Dragon voice recognition software and may include unintentional dictation errors.     Sharyn Creamer, MD 03/16/17 [phone]

## 2017-03-17 LAB — BASIC METABOLIC PANEL
Anion gap: 4 — ABNORMAL LOW (ref 5–15)
BUN: 8 mg/dL (ref 6–20)
CALCIUM: 8.5 mg/dL — AB (ref 8.9–10.3)
CO2: 27 mmol/L (ref 22–32)
Chloride: 108 mmol/L (ref 101–111)
Creatinine, Ser: 1.02 mg/dL (ref 0.61–1.24)
GFR calc Af Amer: >60 mL/min (ref >60)
Glucose, Bld: 108 mg/dL — ABNORMAL HIGH (ref 65–99)
POTASSIUM: 3.8 mmol/L (ref 3.5–5.1)
SODIUM: 139 mmol/L (ref 135–145)

## 2017-03-17 LAB — MAGNESIUM: MAGNESIUM: 2.2 mg/dL (ref 1.7–2.4)

## 2017-03-17 LAB — CBC WITH DIFFERENTIAL/PLATELET
BASOS ABS: 0.1 10*3/uL (ref 0–0.1)
Basophils Relative: 2 %
EOS ABS: 0.8 10*3/uL — AB (ref 0–0.7)
EOS PCT: 14 %
HCT: 36.1 % — ABNORMAL LOW (ref 40.0–52.0)
Hemoglobin: 12.4 g/dL — ABNORMAL LOW (ref 13.0–18.0)
LYMPHS ABS: 1.2 10*3/uL (ref 1.0–3.6)
Lymphocytes Relative: 21 %
MCH: 27.8 pg (ref 26.0–34.0)
MCHC: 34.5 g/dL (ref 32.0–36.0)
MCV: 80.6 fL (ref 80.0–100.0)
Monocytes Absolute: 0.3 10*3/uL (ref 0.2–1.0)
Monocytes Relative: 5 %
Neutro Abs: 3.5 10*3/uL (ref 1.4–6.5)
Neutrophils Relative %: 58 %
PLATELETS: 237 10*3/uL (ref 150–440)
RBC: 4.48 MIL/uL (ref 4.40–5.90)
RDW: 14.9 % — AB (ref 11.5–14.5)
WBC: 6 10*3/uL (ref 3.8–10.6)

## 2017-03-17 MED ORDER — HYDROCODONE-ACETAMINOPHEN 5-325 MG PO TABS
1.0000 | ORAL_TABLET | ORAL | Status: DC | PRN
Start: 2017-03-17 — End: 2017-03-18

## 2017-03-17 MED ORDER — KCL IN DEXTROSE-NACL 20-5-0.45 MEQ/L-%-% IV SOLN
INTRAVENOUS | Status: DC
  Administered 2017-03-17 – 2017-03-18 (×3): via INTRAVENOUS
  Filled 2017-03-17 (×5): qty 1000

## 2017-03-17 MED ORDER — KETOROLAC TROMETHAMINE 30 MG/ML IJ SOLN
30.0000 mg | Freq: Three times a day (TID) | INTRAMUSCULAR | Status: AC
  Administered 2017-03-17 – 2017-03-18 (×3): 30 mg via INTRAVENOUS
  Filled 2017-03-17 (×3): qty 1

## 2017-03-17 NOTE — Progress Notes (Signed)
Pt was able to eat his clear liquid diet with no complaints of nausea, vomiting and no increase in pain.

## 2017-03-17 NOTE — Plan of Care (Signed)
Problem: Safety: Goal: Ability to remain free from injury will improve Outcome: Progressing Pt has remained free from falls during my care.   Problem: Pain Managment: Goal: General experience of comfort will improve Outcome: Progressing Pt has maintained a pain score of 2/10 during my care.   Problem: Fluid Volume: Goal: Ability to maintain a balanced intake and output will improve Outcome: Progressing Pt has been able to tolerate a clear liquid diet and request solid foods during my care. With reluctance, MD agreed on a soft diet as a trial. Will continue to monitor.

## 2017-03-17 NOTE — Progress Notes (Signed)
AVSS. Feeling better. Hungry. Lungs: Clear. ABD: Non-distended, BS+. Tender LLQ. Labs: Remains w/ normal WBC. Of note: Marked eosinophilia in June 2017 with first episode of diverticulitis, elevated but less so on this admission.  Encourage to ambulate. Will start clear liquids.

## 2017-03-17 NOTE — Progress Notes (Signed)
Patient insistent on diet advance, even in light of present episode of inflammation/ infection need for IV antibiotics.  Will yield to his desire.

## 2017-03-18 LAB — CBC
HCT: 37.7 % — ABNORMAL LOW (ref 40.0–52.0)
HEMOGLOBIN: 12.5 g/dL — AB (ref 13.0–18.0)
MCH: 27.5 pg (ref 26.0–34.0)
MCHC: 33.3 g/dL (ref 32.0–36.0)
MCV: 82.7 fL (ref 80.0–100.0)
Platelets: 240 10*3/uL (ref 150–440)
RBC: 4.56 MIL/uL (ref 4.40–5.90)
RDW: 14.9 % — ABNORMAL HIGH (ref 11.5–14.5)
WBC: 4.9 10*3/uL (ref 3.8–10.6)

## 2017-03-18 MED ORDER — HYDROCODONE-ACETAMINOPHEN 5-325 MG PO TABS: 1.0000 | ORAL_TABLET | ORAL | 0 refills | Status: AC | PRN

## 2017-03-18 MED ORDER — AMOXICILLIN-POT CLAVULANATE 875-125 MG PO TABS: 1.0000 | ORAL_TABLET | Freq: Two times a day (BID) | ORAL | Status: DC

## 2017-03-18 MED ORDER — METRONIDAZOLE 500 MG PO TABS: 500.0000 mg | ORAL_TABLET | Freq: Three times a day (TID) | ORAL | 0 refills | Status: AC

## 2017-03-18 MED ORDER — CIPROFLOXACIN HCL 500 MG PO TABS: 500.0000 mg | ORAL_TABLET | Freq: Two times a day (BID) | ORAL | 0 refills | Status: AC

## 2017-03-18 NOTE — Progress Notes (Signed)
03/18/2017 10:01 AM  Asaph Everardo Beals to be D/C'd Home per MD order.  Discussed prescriptions and follow up appointments with the patient. Prescriptions given to patient, medication list explained in detail. Pt verbalized understanding.  Allergies as of 03/18/2017   No Known Allergies     Medication List    STOP taking these medications   oxyCODONE-acetaminophen 5-325 MG tablet Commonly known as:  ROXICET     TAKE these medications   ciprofloxacin 500 MG tablet Commonly known as:  CIPRO Take 1 tablet (500 mg total) by mouth 2 (two) times daily.   HYDROcodone-acetaminophen 5-325 MG tablet Commonly known as:  NORCO/VICODIN Take 1-2 tablets by mouth every 4 (four) hours as needed for moderate pain or severe pain.   metroNIDAZOLE 500 MG tablet Commonly known as:  FLAGYL Take 1 tablet (500 mg total) by mouth 3 (three) times daily.   naproxen 500 MG tablet Commonly known as:  NAPROSYN Take 1 tablet (500 mg total) by mouth 2 (two) times daily with a meal.   ondansetron 4 MG disintegrating tablet Commonly known as:  ZOFRAN ODT Take 1 tablet (4 mg total) by mouth every 8 (eight) hours as needed for nausea or vomiting.       Vitals:   03/17/17 2003 03/18/17 0527  BP: (!) 149/64 (!) 144/75  Pulse: 82 77  Resp: 16 16  Temp: 98.4 F (36.9 C) 98 F (36.7 C)    Skin clean, dry and intact without evidence of skin break down, no evidence of skin tears noted. IV catheter discontinued intact. Site without signs and symptoms of complications. Dressing and pressure applied. Pt denies pain at this time. No complaints noted.  An After Visit Summary was printed and given to the patient. Patient escorted via WC, and D/C home via private auto.  Bradly Chris

## 2017-03-18 NOTE — Discharge Summary (Signed)
Physician Discharge Summary  Patient ID: Edward Pennington MRN: [id number] DOB/AGE: 1981-10-13 36 y.o.  Admit date: 03/16/2017 Discharge date: 03/18/2017  Admission Diagnoses: Acute diverticulitis  Discharge Diagnoses:  Active Problems:   Diverticulitis   Discharged Condition: good  Hospital Course: The patient had rapid resolution of his pain. He was able to tolerate a soft diet on post admission day 2 and desired discharge home. The patient had a normal white count throughout his hospital stay. CT scan showed inflammation in the junction of the descending/sigmoid colon more pronounced than at the time of his 2017 presentation.  Consults: None  Significant Diagnostic Studies: radiology: CT scan: Abdomen/pelvis  Treatments: IV hydration and antibiotics: Zosyn  Discharge Exam: Blood pressure (!) 144/75, pulse 77, temperature 98 F (36.7 C), temperature source Oral, resp. rate 16, height 6' (1.829 m), weight 250 lb (113.4 kg), SpO2 100 %. General appearance: alert Resp: clear to auscultation bilaterally Cardio: regular rate and rhythm, S1, S2 normal, no murmur, click, rub or gallop GI: Nontender, vague fullness left lower quadrant.  Disposition: 01-Home or Self Care   Allergies as of 03/18/2017   No Known Allergies     Medication List    STOP taking these medications   oxyCODONE-acetaminophen 5-325 MG tablet Commonly known as:  ROXICET     TAKE these medications   ciprofloxacin 500 MG tablet Commonly known as:  CIPRO Take 1 tablet (500 mg total) by mouth 2 (two) times daily.   HYDROcodone-acetaminophen 5-325 MG tablet Commonly known as:  NORCO/VICODIN Take 1-2 tablets by mouth every 4 (four) hours as needed for moderate pain or severe pain.   metroNIDAZOLE 500 MG tablet Commonly known as:  FLAGYL Take 1 tablet (500 mg total) by mouth 3 (three) times daily.   naproxen 500 MG tablet Commonly known as:  NAPROSYN Take 1 tablet (500 mg total) by mouth 2 (two) times  daily with a meal.   ondansetron 4 MG disintegrating tablet Commonly known as:  ZOFRAN ODT Take 1 tablet (4 mg total) by mouth every 8 (eight) hours as needed for nausea or vomiting.      Follow-up Information    [facility]. Schedule an appointment as soon as possible for a visit in 1 week(s).   Specialty:  General Surgery Contact information: [address] [place] [address] [phone]          Signed: Earline Mayotte 03/18/2017, 4:28 PM

## 2017-03-18 NOTE — Final Progress Note (Signed)
AVSS. Tolerated diet well. Pain free. Use cipro and flagyl with last episode and tolerated these well. ABD: Soft, non-tender. Vague fullness LLQ that is non-tender.  Discussed going home on oral meds, amenable.  Outpatient f/u w/ surgery.

## 2018-01-15 IMAGING — CT CT ABD-PELV W/ CM
2 of 4 series · 16 of 46 positions shown, 18 images · IV contrast (APPLIED)
Comparison: CT scan dated 06/02/2016

CLINICAL DATA: Left lower quadrant pain for 4 days. History of
diverticulitis.

EXAM:
CT ABDOMEN AND PELVIS WITH CONTRAST
TECHNIQUE: Multidetector CT imaging of the abdomen and pelvis was performed
using the standard protocol following bolus administration of
intravenous contrast.
CONTRAST:  100mL [id number] IOPAMIDOL (JK6KYA-GWW) INJECTION 61%

[Series 2: routine abd/pel with · axial · 0.73mm/px · z∈[-356,+104]mm · 13 of 100 slices shown, 15 images]
[im 4/100  soft-tissue]
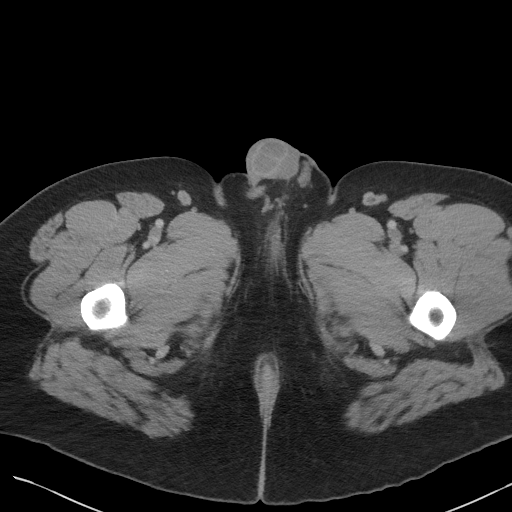
[im 4/100  bone]
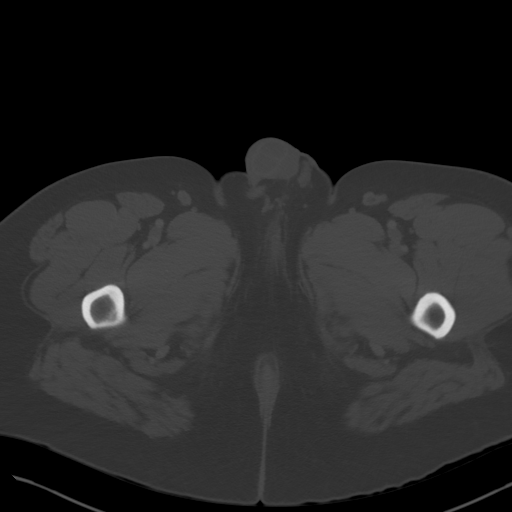
[im 12/100  soft-tissue]
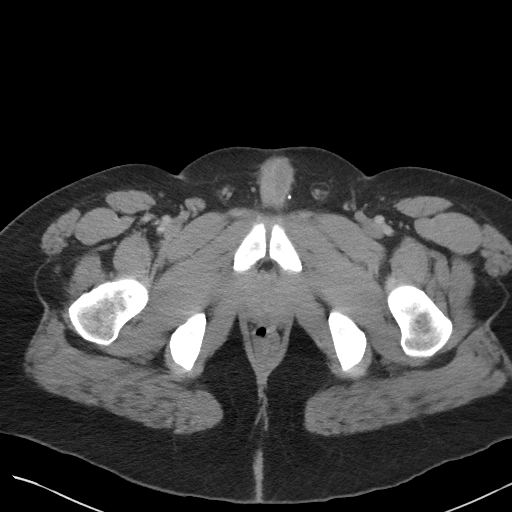
[im 20/100  soft-tissue]
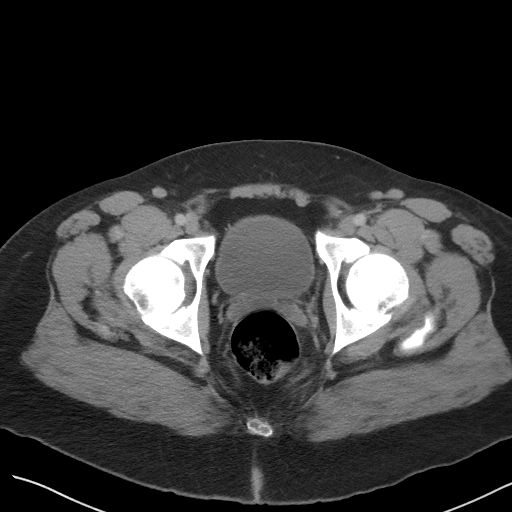
[im 28/100  soft-tissue]
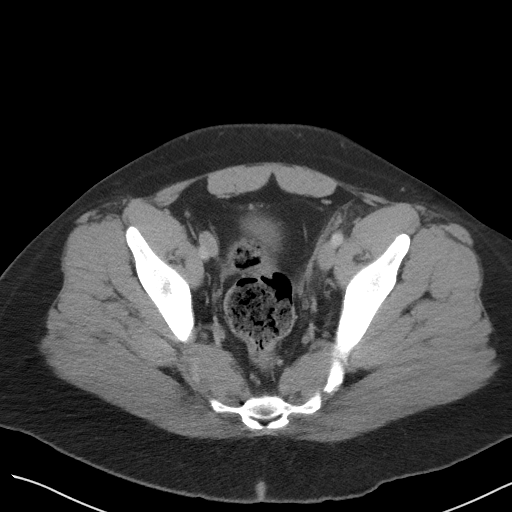
[im 36/100  soft-tissue]
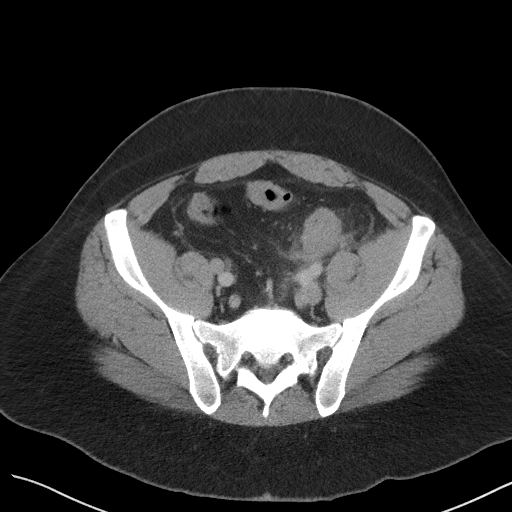
[im 44/100  soft-tissue]
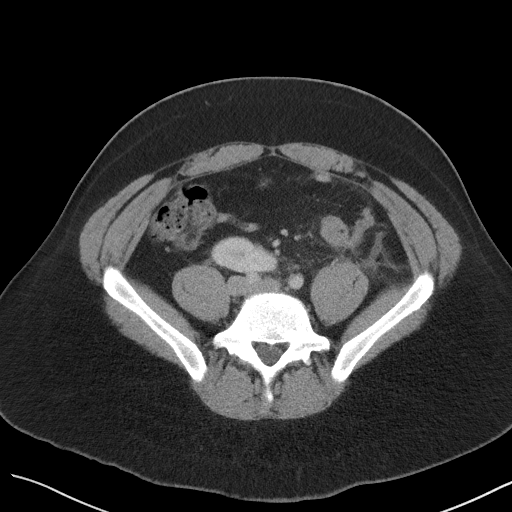
[im 52/100  soft-tissue]
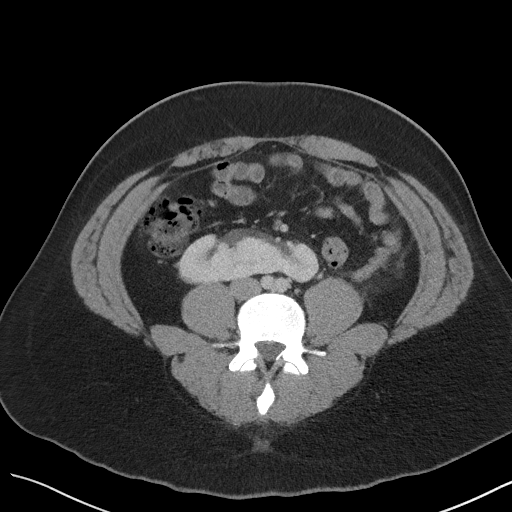
[im 56/100  soft-tissue]
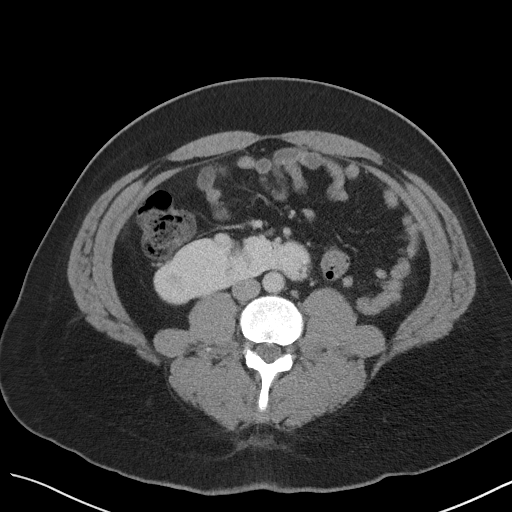
[im 64/100  soft-tissue]
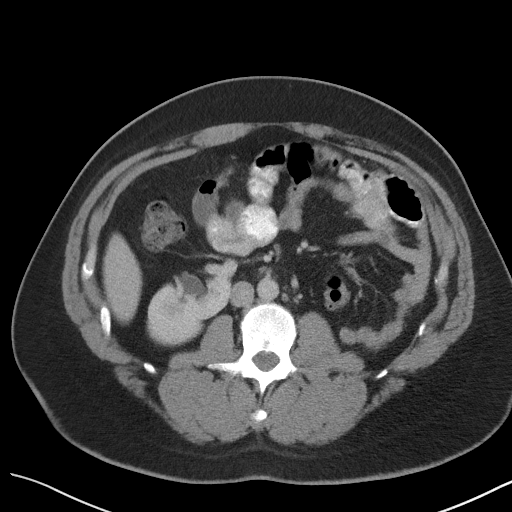
[im 64/100  bone]
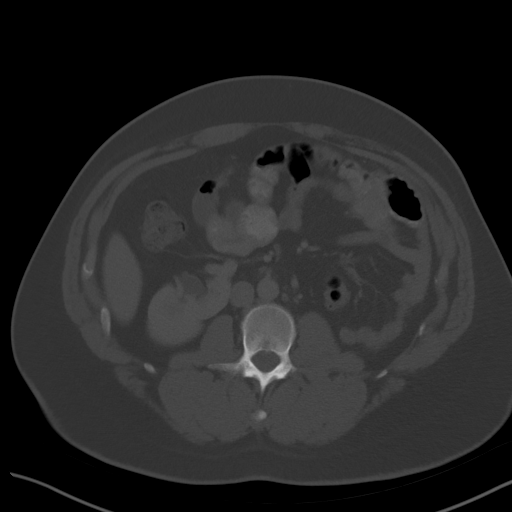
[im 72/100  soft-tissue]
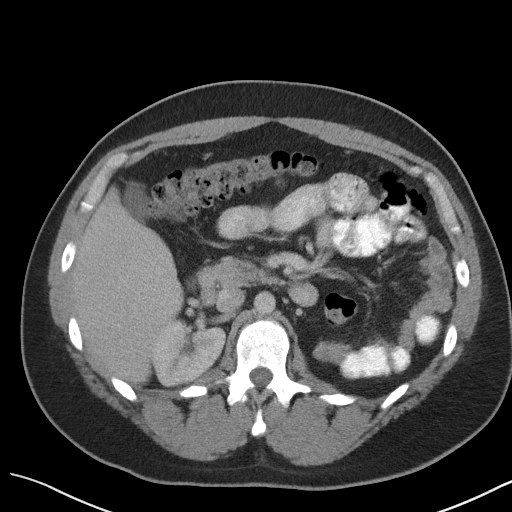
[im 80/100  soft-tissue]
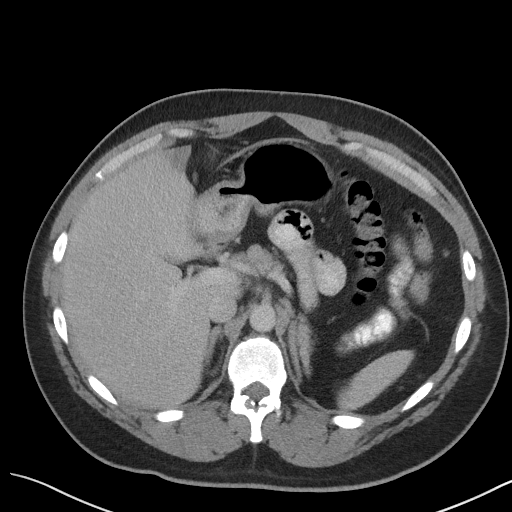
[im 88/100  soft-tissue]
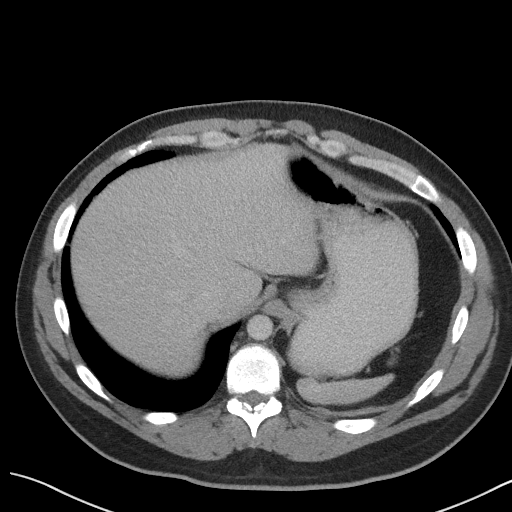
[im 96/100  soft-tissue]
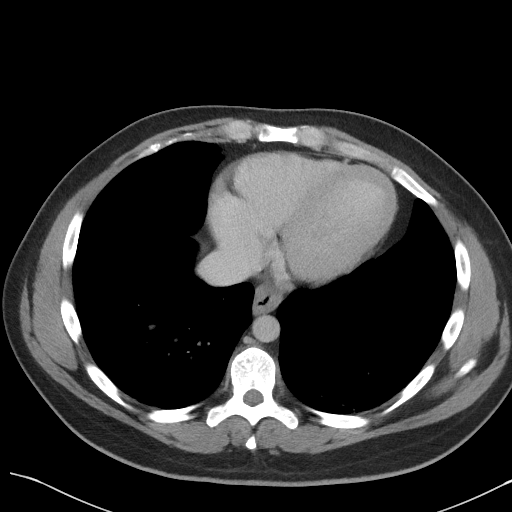

[Series 5: coronal st · coronal · 0.79mm/px · 3 of 98 slices shown]
[im 33/98  soft-tissue]
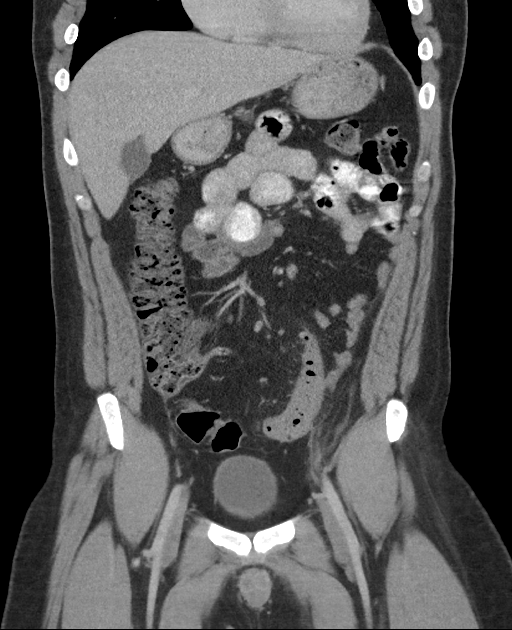
[im 44/98  soft-tissue]
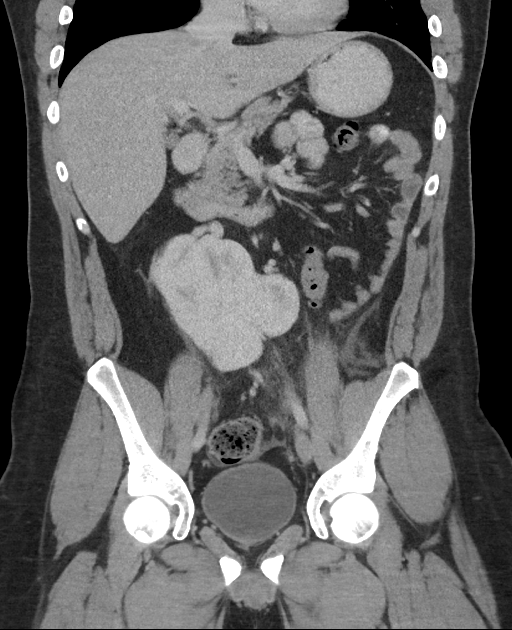
[im 54/98  soft-tissue]
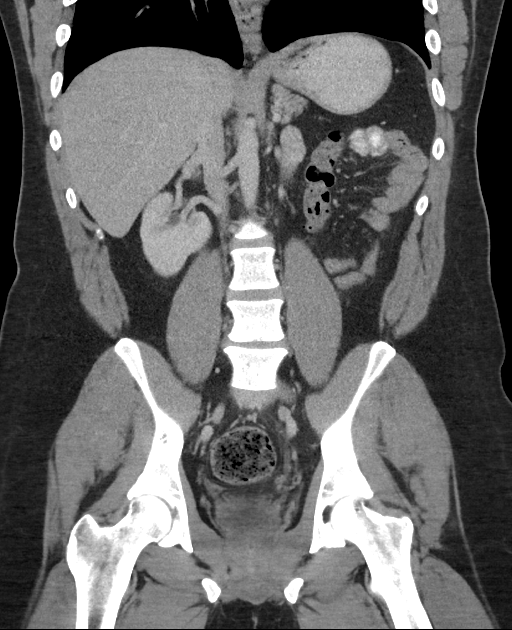

[16 of 46 positions shown; findings below may reference images not displayed]

FINDINGS: Lower chest: Negative.

Hepatobiliary: No focal liver abnormality is seen. No gallstones,
gallbladder wall thickening, or biliary dilatation.

Pancreas: Unremarkable. No pancreatic ductal dilatation or
surrounding inflammatory changes.

Spleen: Normal in size without focal abnormality.

Adrenals/Urinary Tract: Normal adrenal glands. Congenital crossed
fused ectopia. No hydronephrosis. Tiny stone seen in the left
portion of the fused kidney on the prior study is no longer present.
Bladder is normal.

Stomach/Bowel: New focal diverticulitis of the distal descending
colon with soft tissue stranding and a small amount of pericolonic
fluid. There is a small phlegmon at that site. No discrete abscess
or perforation. The bowel otherwise appears normal.

Vascular/Lymphatic: No significant vascular findings are present. No
enlarged abdominal or pelvic lymph nodes.

Reproductive: Prostate is unremarkable.

Other: No abdominal wall hernia or abnormality. No abdominopelvic
ascites.

Musculoskeletal: New broad-based soft disc protrusion at L4-5.
Otherwise normal.
IMPRESSION: Focal diverticulitis of the distal descending colon with a small
focal phlegmon and pericolonic fluid. The appearance is slightly
worse than on the prior study of 5275.

No other acute findings.

Crossed fused renal ectopia.

## 2019-10-14 ENCOUNTER — Other Ambulatory Visit: Payer: Self-pay

## 2019-10-14 DIAGNOSIS — Z20822 Contact with and (suspected) exposure to covid-19: Secondary | ICD-10-CM

## 2019-10-15 LAB — NOVEL CORONAVIRUS, NAA: SARS-CoV-2, NAA: NOT DETECTED

## 2019-11-20 ENCOUNTER — Other Ambulatory Visit: Payer: Self-pay

## 2019-11-20 DIAGNOSIS — Z20822 Contact with and (suspected) exposure to covid-19: Secondary | ICD-10-CM

## 2019-11-22 LAB — NOVEL CORONAVIRUS, NAA: SARS-CoV-2, NAA: NOT DETECTED
